# Patient Record
Sex: Male | Born: 1985 | Race: Black or African American | Hispanic: No | Marital: Single | Smoking: Current every day smoker
Health system: Southern US, Community
[De-identification: ages and names within clinical notes are randomized; demographics above are authoritative.]

---

## 2004-08-20 ENCOUNTER — Emergency Department (HOSPITAL_COMMUNITY): Admission: EM | Admit: 2004-08-20 | Discharge: 2004-08-20 | Payer: Self-pay | Admitting: Emergency Medicine

## 2013-10-20 ENCOUNTER — Emergency Department (INDEPENDENT_AMBULATORY_CARE_PROVIDER_SITE_OTHER)
Admission: EM | Admit: 2013-10-20 | Discharge: 2013-10-20 | Disposition: A | Discharge status: Home / Self Care | Payer: PRIVATE HEALTH INSURANCE | Source: Home / Self Care

## 2013-10-20 ENCOUNTER — Encounter (HOSPITAL_COMMUNITY): Payer: Self-pay | Admitting: Emergency Medicine

## 2013-10-20 DIAGNOSIS — J029 Acute pharyngitis, unspecified: Secondary | ICD-10-CM

## 2013-10-20 LAB — POCT RAPID STREP A: Streptococcus, Group A Screen (Direct): NEGATIVE

## 2013-10-20 MED ORDER — AMOXICILLIN 875 MG PO TABS: 875.0000 mg | ORAL_TABLET | Freq: Two times a day (BID) | ORAL | Status: DC

## 2013-10-20 MED ORDER — DEXAMETHASONE SODIUM PHOSPHATE 10 MG/ML IJ SOLN
10.0000 mg | Freq: Once | INTRAMUSCULAR | Status: AC
  Administered 2013-10-20: 10 mg

## 2013-10-20 MED ORDER — DEXAMETHASONE 10 MG/ML FOR PEDIATRIC ORAL USE
INTRAMUSCULAR | Status: AC
  Filled 2013-10-20: qty 1

## 2013-10-20 NOTE — ED Notes (Signed)
C/o sore throat two days  States he can barely talk OTC medication taking but no relief.

## 2013-10-20 NOTE — ED Provider Notes (Signed)
CSN: [id number]     Arrival date & time 10/20/13  0808 History   None    Chief Complaint  Patient presents with  . Sore Throat     (Consider location/radiation/quality/duration/timing/severity/associated sxs/prior Treatment) Patient is a 28 y.o. male presenting with pharyngitis. The history is provided by the patient.  Sore Throat This is a new problem. The current episode started 2 days ago. The problem occurs constantly. The problem has been gradually worsening. Associated symptoms include headaches. The symptoms are aggravated by swallowing. Nothing relieves the symptoms. Treatments tried: otc cold medicines. The treatment provided no relief.    History reviewed. No pertinent past medical history. History reviewed. No pertinent past surgical history. History reviewed. No pertinent family history. History  Substance Use Topics  . Smoking status: Not on file  . Smokeless tobacco: Not on file  . Alcohol Use: Not on file    Review of Systems  Constitutional: Negative for chills and fatigue.  HENT: Positive for postnasal drip and sore throat. Negative for congestion, ear pain and sinus pressure.   Respiratory: Negative for cough.   Neurological: Positive for headaches.      Allergies  Review of patient's allergies indicates no known allergies.  Home Medications   Current Outpatient Rx  Name  Route  Sig  Dispense  Refill  . amoxicillin (AMOXIL) 875 MG tablet   Oral   Take 1 tablet (875 mg total) by mouth 2 (two) times daily.   20 tablet   0    BP 149/76  Pulse 96  Temp(Src) 98.9 F (37.2 C) (Oral)  Resp 18  SpO2 95% Physical Exam  Constitutional: He appears well-developed and well-nourished.  Appears in pain  HENT:  Right Ear: Tympanic membrane, external ear and ear canal normal.  Left Ear: Tympanic membrane, external ear and ear canal normal.  Nose: Nose normal.  Mouth/Throat: Posterior oropharyngeal edema and posterior oropharyngeal erythema present. No  tonsillar abscesses.  Cardiovascular: Normal rate and regular rhythm.   Pulmonary/Chest: Effort normal and breath sounds normal.  Lymphadenopathy:       Head (right side): No submental, no submandibular and no tonsillar adenopathy present.       Head (left side): No submental, no submandibular and no tonsillar adenopathy present.    He has no cervical adenopathy.    ED Course  Procedures (including critical care time) Labs Review Labs Reviewed  POCT RAPID STREP A ([facility] CARE ONLY)   Imaging Review No results found.    MDM   Final diagnoses:  Pharyngitis   [facility]  Rx amoxicillin 875mg  po BID #20. Suspicious for strep despite negative rapid strep.       Cathlyn ParsonsAngela M Kami Kube, NP 10/20/13 [phone]

## 2013-10-20 NOTE — ED Provider Notes (Signed)
Medical screening examination/treatment/procedure(s) were performed by non-physician practitioner and as supervising physician I was immediately available for consultation/collaboration.  Sona Nations, M.D.  Jahad Old C Liller Yohn, MD 10/20/13 0952 

## 2013-10-20 NOTE — Discharge Instructions (Signed)
Gargle with warm salt water several times a day to help reduce the swelling. Use tylenol or ibuprofen for the pain also. If you are not getting better, or if you are getting much worse, come back.    Pharyngitis Pharyngitis is redness, pain, and swelling (inflammation) of your pharynx.  CAUSES  Pharyngitis is usually caused by infection. Most of the time, these infections are from viruses (viral) and are part of a cold. However, sometimes pharyngitis is caused by bacteria (bacterial). Pharyngitis can also be caused by allergies. Viral pharyngitis may be spread from person to person by coughing, sneezing, and personal items or utensils (cups, forks, spoons, toothbrushes). Bacterial pharyngitis may be spread from person to person by more intimate contact, such as kissing.  SIGNS AND SYMPTOMS  Symptoms of pharyngitis include:   Sore throat.   Tiredness (fatigue).   Low-grade fever.   Headache.  Joint pain and muscle aches.  Skin rashes.  Swollen lymph nodes.  Plaque-like film on throat or tonsils (often seen with bacterial pharyngitis). DIAGNOSIS  Your health care provider will ask you questions about your illness and your symptoms. Your medical history, along with a physical exam, is often all that is needed to diagnose pharyngitis. Sometimes, a rapid strep test is done. Other lab tests may also be done, depending on the suspected cause.  TREATMENT  Viral pharyngitis will usually get better in 3 4 days without the use of medicine. Bacterial pharyngitis is treated with medicines that kill germs (antibiotics).  HOME CARE INSTRUCTIONS   Drink enough water and fluids to keep your urine clear or pale yellow.   Only take over-the-counter or prescription medicines as directed by your health care provider:   If you are prescribed antibiotics, make sure you finish them even if you start to feel better.   Do not take aspirin.   Get lots of rest.   Gargle with 8 oz of salt water  ( tsp of salt per 1 qt of water) as often as every 1 2 hours to soothe your throat.   Throat lozenges (if you are not at risk for choking) or sprays may be used to soothe your throat. SEEK MEDICAL CARE IF:   You have large, tender lumps in your neck.  You have a rash.  You cough up green, yellow-brown, or bloody spit. SEEK IMMEDIATE MEDICAL CARE IF:   Your neck becomes stiff.  You drool or are unable to swallow liquids.  You vomit or are unable to keep medicines or liquids down.  You have severe pain that does not go away with the use of recommended medicines.  You have trouble breathing (not caused by a stuffy nose). MAKE SURE YOU:   Understand these instructions.  Will watch your condition.  Will get help right away if you are not doing well or get worse. Document Released: 08/18/2005 Document Revised: 06/08/2013 Document Reviewed: 04/25/2013 [facility] Patient Information 2014 [employer], [place].

## 2013-10-22 LAB — CULTURE, GROUP A STREP

## 2013-11-22 ENCOUNTER — Encounter (HOSPITAL_COMMUNITY): Payer: Self-pay | Admitting: Emergency Medicine

## 2013-11-22 ENCOUNTER — Emergency Department (INDEPENDENT_AMBULATORY_CARE_PROVIDER_SITE_OTHER)
Admission: EM | Admit: 2013-11-22 | Discharge: 2013-11-22 | Disposition: A | Discharge status: Home / Self Care | Payer: Self-pay | Source: Home / Self Care

## 2013-11-22 DIAGNOSIS — H109 Unspecified conjunctivitis: Secondary | ICD-10-CM

## 2013-11-22 MED ORDER — TOBRAMYCIN 0.3 % OP SOLN: 1.0000 [drp] | OPHTHALMIC | Status: DC

## 2013-11-22 NOTE — ED Provider Notes (Signed)
Medical screening examination/treatment/procedure(s) were performed by resident physician or non-physician practitioner and as supervising physician I was immediately available for consultation/collaboration.   KINDL,JAMES DOUGLAS MD.   James D Kindl, MD 11/22/13 2129 

## 2013-11-22 NOTE — Discharge Instructions (Signed)

## 2013-11-22 NOTE — ED Notes (Addendum)
C/o L eye redness and pain onset yesterday.  C/o photophobia, slight headache and eye ache yesterday 1/10.  States vision is slightly blurred.  Wears contacts but took them out yesterday.  L upper eyelid is puffy.

## 2013-11-22 NOTE — ED Provider Notes (Signed)
CSN: [id number]     Arrival date & time 11/22/13  1702 History   None    Chief Complaint  Patient presents with  . Eye Pain   (Consider location/radiation/quality/duration/timing/severity/associated sxs/prior Treatment) HPI Comments: No changes in vision, fever, eye pain, discharge. Does wear extended wear contact lenses. No recent injury. No recent URI sx. No photophobia.  Patient is a 28 y.o. male presenting with conjunctivitis. The history is provided by the patient.  Conjunctivitis This is a new problem. The current episode started yesterday. The problem occurs constantly. The problem has been gradually worsening.    History reviewed. No pertinent past medical history. History reviewed. No pertinent past surgical history. History reviewed. No pertinent family history. History  Substance Use Topics  . Smoking status: Current Every Day Smoker -- 1.00 packs/day    Types: Cigarettes  . Smokeless tobacco: Not on file  . Alcohol Use: 4.2 oz/week    7 Cans of beer per week    Review of Systems  All other systems reviewed and are negative.    Allergies  Review of patient's allergies indicates no known allergies.  Home Medications   Current Outpatient Rx  Name  Route  Sig  Dispense  Refill  . amoxicillin (AMOXIL) 875 MG tablet   Oral   Take 1 tablet (875 mg total) by mouth 2 (two) times daily.   20 tablet   0   . tobramycin (TOBREX) 0.3 % ophthalmic solution   Left Eye   Place 1 drop into the left eye every 4 (four) hours. X 7 days   5 mL   0    BP 152/96  Pulse 83  Temp(Src) 98.2 F (36.8 C) (Oral)  Resp 18  SpO2 100% Physical Exam  Nursing note and vitals reviewed. Constitutional: He is oriented to person, place, and time. He appears well-developed and well-nourished. No distress.  HENT:  Head: Normocephalic and atraumatic.  Eyes: EOM and lids are normal. Pupils are equal, round, and reactive to light. Right eye exhibits no discharge. Left eye exhibits no  discharge, no exudate and no hordeolum. No foreign body present in the left eye. Left conjunctiva is injected. Left conjunctiva has no hemorrhage. No scleral icterus.  Slit lamp exam:      The left eye shows no corneal abrasion, no corneal ulcer, no foreign body, no hyphema, no hypopyon and no fluorescein uptake.  Neck: Normal range of motion. Neck supple.  Cardiovascular: Normal rate.   Pulmonary/Chest: Effort normal.  Musculoskeletal: Normal range of motion.  Neurological: He is alert and oriented to person, place, and time.  Skin: Skin is warm and dry.  Psychiatric: He has a normal mood and affect. His behavior is normal.    ED Course  Procedures (including critical care time) Labs Review Labs Reviewed - No data to display Imaging Review No results found.   MDM   1. Conjunctivitis    Advised discarding recently worn contact lenses and not return to contact use for at least two weeks. Tobrex as prescribed and follow up with his ophthalmologist if symptoms do not improve.   Jess BartersJennifer Lee Clifton SpringsPresson, GeorgiaPA 11/22/13 [id number]

## 2014-08-03 ENCOUNTER — Encounter (HOSPITAL_COMMUNITY): Payer: Self-pay | Admitting: *Deleted

## 2014-08-03 ENCOUNTER — Emergency Department (INDEPENDENT_AMBULATORY_CARE_PROVIDER_SITE_OTHER)
Admission: EM | Admit: 2014-08-03 | Discharge: 2014-08-03 | Disposition: A | Discharge status: Home / Self Care | Payer: 59 | Source: Home / Self Care | Attending: Emergency Medicine | Admitting: Emergency Medicine

## 2014-08-03 DIAGNOSIS — M7701 Medial epicondylitis, right elbow: Secondary | ICD-10-CM

## 2014-08-03 MED ORDER — DICLOFENAC SODIUM 1 % TD GEL: 2.0000 g | Freq: Four times a day (QID) | TRANSDERMAL | Status: DC

## 2014-08-03 NOTE — ED Provider Notes (Signed)
CSN: [id number]     Arrival date & time 08/03/14  1537 History   First MD Initiated Contact with Patient 08/03/14 1606     Chief Complaint  Patient presents with  . Elbow Problem   (Consider location/radiation/quality/duration/timing/severity/associated sxs/prior Treatment) HPI          28 year old male presents for evaluation of right elbow pain. This pain is been present for about one week. He thinks he has sprained it but he denies any injury. He has pain in the elbow that is worse when he flexes his wrist. When he flexes his elbow he also has pain in the anterior elbow. It started gradually and has gotten progressively worse. He has tried icing it without much relief. He denies doing any regular heavy lifting, he spends most of his day typing on a computer. He denies any numbness in the arm. He feels slight swelling in the elbow. No systemic symptoms including no fever, chills, NVD. No history of inflammatory arthritis.  History reviewed. No pertinent past medical history. History reviewed. No pertinent past surgical history. No family history on file. History  Substance Use Topics  . Smoking status: Current Every Day Smoker -- 1.00 packs/day    Types: Cigarettes  . Smokeless tobacco: Not on file  . Alcohol Use: 4.2 oz/week    7 Cans of beer per week    Review of Systems  Musculoskeletal: Positive for joint swelling and arthralgias.  All other systems reviewed and are negative.   Allergies  Review of patient's allergies indicates no known allergies.  Home Medications   Prior to Admission medications   Medication Sig Start Date End Date Taking? Authorizing Provider  amoxicillin (AMOXIL) 875 MG tablet Take 1 tablet (875 mg total) by mouth 2 (two) times daily. 10/20/13   Cathlyn ParsonsAngela M Kabbe, NP  diclofenac sodium (VOLTAREN) 1 % GEL Apply 2 g topically 4 (four) times daily. 08/03/14   Adrian BlackwaterZachary H Lelend Heinecke, PA-C  tobramycin (TOBREX) 0.3 % ophthalmic solution Place 1 drop into the left eye  every 4 (four) hours. X 7 days 11/22/13   Jess BartersJennifer Lee H Presson, PA   BP 146/93 mmHg  Pulse 69  Temp(Src) 98.6 F (37 C) (Oral)  Resp 14  SpO2 98% Physical Exam  Constitutional: He is oriented to person, place, and time. He appears well-developed and well-nourished. No distress.  HENT:  Head: Normocephalic.  Cardiovascular:  Pulses:      Radial pulses are 2+ on the right side.  Pulmonary/Chest: Effort normal. No respiratory distress.  Musculoskeletal:       Right elbow: He exhibits swelling (very slight swelling over the medial epicondyles). Tenderness found. Medial epicondyle (And in the groove directly posterior to the medial epicondyle) tenderness noted.  With the elbow in extension, there is pain in the area of the medial epicondyles with wrist flexion against resistance  Neurological: He is alert and oriented to person, place, and time. Coordination normal.  Skin: Skin is warm and dry. No rash noted. He is not diaphoretic.  Psychiatric: He has a normal mood and affect. Judgment normal.  Nursing note and vitals reviewed.   ED Course  Procedures (including critical care time) Labs Review Labs Reviewed - No data to display  Imaging Review No results found.   MDM   1. Medial epicondylitis of elbow, right    Consistent with medial epicondylitis. We'll treat initially with diclofenac gel 4 times a day. Follow-up with sports medicine if no improvement.  Meds ordered this encounter  Medications  . diclofenac sodium (VOLTAREN) 1 % GEL    Sig: Apply 2 g topically 4 (four) times daily.    Dispense:  200 g    Refill:  1    Order Specific Question:  Supervising Provider    Answer:  Bradd CanaryKINDL, JAMES D [[id number]]      Graylon GoodZachary H Susana Gripp, PA-C 08/03/14 [phone]

## 2014-08-03 NOTE — ED Notes (Addendum)
Pt reports   Symptoms of  Pain  r elbow     For   About 1  Week     denys any injury        Pt  Reports  pain is  worse on  movements  And  Certain  posistion

## 2014-08-03 NOTE — Discharge Instructions (Signed)
Medial Epicondylitis (Golfer's Elbow) with Rehab Medial epicondylitis involves inflammation and pain around the inner (medial) portion of the elbow. This pain is caused by inflammation of the tendons in the forearm that flex (bring down) the wrist. Medial epicondylitis is also called golfer's elbow, because it is common among golfers. However, it may occur in any individual who flexes the wrist regularly. If medial epicondylitis is left untreated, it may become a chronic problem. SYMPTOMS   Pain, tenderness, or inflammation over the inner (medial) side of the elbow.  Pain or weakness with gripping activities.  Pain that increases with wrist twisting motions (using a screwdriver, playing golf, bowling). CAUSES  Medial epicondylitis is caused by inflammation of the tendons that flex the wrist. Causes of injury may include:  Chronic, repetitive stress and strain to the tendons that run from the wrist and forearm to the elbow.  Sudden strain on the forearm, including wrist snap when serving balls with racquet sports, or throwing a baseball. RISK INCREASES WITH:  Sports or occupations that require repetitive and/or strenuous forearm and wrist movements (pitching a baseball, golfing, carpentry).  Poor wrist and forearm strength and flexibility.  Failure to warm up properly before activity.  Resuming activity before healing, rehabilitation, and conditioning are complete. PREVENTION   Warm up and stretch properly before activity.  Maintain physical fitness:  Strength, flexibility, and endurance.  Cardiovascular fitness.  Wear and use properly fitted equipment.  Learn and use proper technique and have a coach correct improper technique.  Wear a tennis elbow (counterforce) brace. PROGNOSIS  The course of this condition depends on the degree of the injury. If treated properly, acute cases (symptoms lasting less than 4 weeks) are often resolved in 2 to 6 weeks. Chronic (longer lasting  cases) often resolve in 3 to 6 months, but may require physical therapy. RELATED COMPLICATIONS   Frequently recurring symptoms, resulting in a chronic problem. Properly treating the problem the first time decreases frequency of recurrence.  Chronic inflammation, scarring, and partial tendon tear, requiring surgery.  Delayed healing or resolution of symptoms. TREATMENT  Treatment first involves the use of ice and medicine, to reduce pain and inflammation. Strengthening and stretching exercises may reduce discomfort, if performed regularly. These exercises may be performed at home, if the condition is an acute injury. Chronic cases may require a referral to a physical therapist for evaluation and treatment. Your caregiver may advise a corticosteroid injection to help reduce inflammation. Rarely, surgery is needed. MEDICATION  If pain medicine is needed, nonsteroidal anti-inflammatory medicines (aspirin and ibuprofen), or other minor pain relievers (acetaminophen), are often advised.  Do not take pain medicine for 7 days before surgery.  Prescription pain relievers may be given, if your caregiver thinks they are needed. Use only as directed and only as much as you need.  Corticosteroid injections may be recommended. These injections should be reserved only for the most severe cases, because they can only be given a certain number of times. HEAT AND COLD  Cold treatment (icing) should be applied for 10 to 15 minutes every 2 to 3 hours for inflammation and pain, and immediately after activity that aggravates your symptoms. Use ice packs or an ice massage.  Heat treatment may be used before performing stretching and strengthening activities prescribed by your caregiver, physical therapist, or athletic trainer. Use a heat pack or a warm water soak. SEEK MEDICAL CARE IF: Symptoms get worse or do not improve in 2 weeks, despite treatment. EXERCISES  RANGE OF MOTION (  ROM) AND STRETCHING EXERCISES -  Epicondylitis, Medial (Golfer's Elbow) These exercises may help you when beginning to rehabilitate your injury. Your symptoms may go away with or without further involvement from your physician, physical therapist or athletic trainer. While completing these exercises, remember:   Restoring tissue flexibility helps normal motion to return to the joints. This allows healthier, less painful movement and activity.  An effective stretch should be held for at least 30 seconds.  A stretch should never be painful. You should only feel a gentle lengthening or release in the stretched tissue. RANGE OF MOTION - Wrist Flexion, Active-Assisted  Extend your right / left elbow with your fingers pointing down.*  Gently pull the back of your hand towards you, until you feel a gentle stretch on the top of your forearm.  Hold this position for __________ seconds. Repeat __________ times. Complete this exercise __________ times per day.  *If directed by your physician, physical therapist or athletic trainer, complete this stretch with your elbow bent, rather than extended. RANGE OF MOTION - Wrist Extension, Active-Assisted  Extend your right / left elbow and turn your palm upwards.*  Gently pull your palm and fingertips back, so your wrist extends and your fingers point more toward the ground.  You should feel a gentle stretch on the inside of your forearm.  Hold this position for __________ seconds. Repeat __________ times. Complete this exercise __________ times per day. *If directed by your physician, physical therapist or athletic trainer, complete this stretch with your elbow bent, rather than extended. STRETCH - Wrist Extension   Place your right / left fingertips on a tabletop leaving your elbow slightly bent. Your fingers should point backwards.  Gently press your fingers and palm down onto the table, by straightening your elbow. You should feel a stretch on the inside of your forearm.  Hold  this position for __________ seconds. Repeat __________ times. Complete this stretch __________ times per day.  STRENGTHENING EXERCISES - Epicondylitis, Medial (Golfer's Elbow) These exercises may help you when beginning to rehabilitate your injury. They may resolve your symptoms with or without further involvement from your physician, physical therapist or athletic trainer. While completing these exercises, remember:   Muscles can gain both the endurance and the strength needed for everyday activities through controlled exercises.  Complete these exercises as instructed by your physician, physical therapist or athletic trainer. Increase the resistance and repetitions only as guided.  You may experience muscle soreness or fatigue, but the pain or discomfort you are trying to eliminate should never worsen during these exercises. If this pain does get worse, stop and make sure you are following the directions exactly. If the pain is still present after adjustments, discontinue the exercise until you can discuss the trouble with your caregiver. STRENGTH - Wrist Flexors  Sit with your right / left forearm palm-up, and fully supported on a table or countertop. Your elbow should be resting below the height of your shoulder. Allow your wrist to extend over the edge of the surface.  Loosely holding a __________ weight, or a piece of rubber exercise band or tubing, slowly curl your hand up toward your forearm.  Hold this position for __________ seconds. Slowly lower the wrist back to the starting position in a controlled manner. Repeat __________ times. Complete this exercise __________ times per day.  STRENGTH - Wrist Extensors  Sit with your right / left forearm palm-down and fully supported. Your elbow should be resting below the height of your shoulder.   Allow your wrist to extend over the edge of the surface.  Loosely holding a __________ weight, or a piece of rubber exercise band or tubing, slowly  curl your hand up toward your forearm.  Hold this position for __________ seconds. Slowly lower the wrist back to the starting position in a controlled manner. Repeat __________ times. Complete this exercise __________ times per day.  STRENGTH - Ulnar Deviators  Stand with a ____________________ weight in your right / left hand, or sit while holding a rubber exercise band or tubing, with your healthy arm supported on a table or countertop.  Move your wrist so that your pinkie travels toward your forearm and your thumb moves away from your forearm.  Hold this position for __________ seconds and then slowly lower the wrist back to the starting position. Repeat __________ times. Complete this exercise __________ times per day STRENGTH - Grip   Grasp a tennis ball, a dense sponge, or a large, rolled sock in your hand.  Squeeze as hard as you can, without increasing any pain.  Hold this position for __________ seconds. Release your grip slowly. Repeat __________ times. Complete this exercise __________ times per day.  STRENGTH - Forearm Supinators   Sit with your right / left forearm supported on a table, keeping your elbow below shoulder height. Rest your hand over the edge, palm down.  Gently grip a hammer or a soup ladle.  Without moving your elbow, slowly turn your palm and hand upward to a "thumbs-up" position.  Hold this position for __________ seconds. Slowly return to the starting position. Repeat __________ times. Complete this exercise __________ times per day.  STRENGTH - Forearm Pronators  Sit with your right / left forearm supported on a table, keeping your elbow below shoulder height. Rest your hand over the edge, palm up.  Gently grip a hammer or a soup ladle.  Without moving your elbow, slowly turn your palm and hand upward to a "thumbs-up" position.  Hold this position for __________ seconds. Slowly return to the starting position. Repeat __________ times. Complete  this exercise __________ times per day.  Document Released: 08/18/2005 Document Revised: 11/10/2011 Document Reviewed: 11/30/2008 [employer] Patient Information 2015 [employer]. This information is not intended to replace advice given to you by your health care provider. Make sure you discuss any questions you have with your health care provider.  

## 2015-07-02 ENCOUNTER — Emergency Department (HOSPITAL_COMMUNITY)
Admission: EM | Admit: 2015-07-02 | Discharge: 2015-07-02 | Disposition: A | Discharge status: Home / Self Care | Payer: 59 | Attending: Emergency Medicine | Admitting: Emergency Medicine

## 2015-07-02 ENCOUNTER — Encounter (HOSPITAL_COMMUNITY): Payer: Self-pay | Admitting: Emergency Medicine

## 2015-07-02 ENCOUNTER — Emergency Department (HOSPITAL_COMMUNITY): Payer: 59

## 2015-07-02 DIAGNOSIS — Y9367 Activity, basketball: Secondary | ICD-10-CM | POA: Insufficient documentation

## 2015-07-02 DIAGNOSIS — X58XXXA Exposure to other specified factors, initial encounter: Secondary | ICD-10-CM | POA: Insufficient documentation

## 2015-07-02 DIAGNOSIS — Z720 Tobacco use: Secondary | ICD-10-CM | POA: Insufficient documentation

## 2015-07-02 DIAGNOSIS — Z791 Long term (current) use of non-steroidal anti-inflammatories (NSAID): Secondary | ICD-10-CM | POA: Insufficient documentation

## 2015-07-02 DIAGNOSIS — Y92310 Basketball court as the place of occurrence of the external cause: Secondary | ICD-10-CM | POA: Insufficient documentation

## 2015-07-02 DIAGNOSIS — Y998 Other external cause status: Secondary | ICD-10-CM | POA: Insufficient documentation

## 2015-07-02 DIAGNOSIS — M25562 Pain in left knee: Secondary | ICD-10-CM

## 2015-07-02 DIAGNOSIS — S8392XA Sprain of unspecified site of left knee, initial encounter: Secondary | ICD-10-CM | POA: Insufficient documentation

## 2015-07-02 DIAGNOSIS — M25462 Effusion, left knee: Secondary | ICD-10-CM | POA: Insufficient documentation

## 2015-07-02 DIAGNOSIS — Z792 Long term (current) use of antibiotics: Secondary | ICD-10-CM | POA: Insufficient documentation

## 2015-07-02 MED ORDER — NAPROXEN 500 MG PO TABS: 500.0000 mg | ORAL_TABLET | Freq: Two times a day (BID) | ORAL | Status: DC | PRN

## 2015-07-02 MED ORDER — HYDROCODONE-ACETAMINOPHEN 5-325 MG PO TABS
1.0000 | ORAL_TABLET | Freq: Once | ORAL | Status: AC
Start: 2015-07-02 — End: 2015-07-02
  Administered 2015-07-02: 1 via ORAL
  Filled 2015-07-02: qty 1

## 2015-07-02 MED ORDER — HYDROCODONE-ACETAMINOPHEN 5-325 MG PO TABS: 1.0000 | ORAL_TABLET | Freq: Four times a day (QID) | ORAL | Status: DC | PRN

## 2015-07-02 NOTE — Discharge Instructions (Signed)
Wear knee immobilizer for at least 2 weeks for stabilization of knee. Use crutches as needed for comfort. Ice and elevate knee throughout the day. Alternate between naprosyn and norco for pain relief. Do not drive or operate machinery with pain medication use. Call orthopedic follow up today or tomorrow to schedule followup appointment for recheck of ongoing knee pain in one to two weeks that can be canceled with a 24-48 hour notice if complete resolution of pain. Return to the ER for changes or worsening symptoms.   Knee Sprain A knee sprain is a tear in the strong bands of tissue that connect the bones (ligaments) of your knee. HOME CARE  Raise (elevate) your injured knee to lessen puffiness (swelling).  To ease pain and puffiness, put ice on the injured area.  Put ice in a plastic bag.  Place a towel between your skin and the bag.  Leave the ice on for 20 minutes, 2-3 times a day.  Only take medicine as told by your doctor.  Do not leave your knee unprotected until pain and stiffness go away (usually 4-6 weeks).  If you have a cast or splint, do not get it wet. If your doctor told you to not take it off, cover it with a plastic bag when you shower or bathe. Do not swim.  Your doctor may have you do exercises to prevent or limit permanent weakness and stiffness. GET HELP RIGHT AWAY IF:   Your cast or splint becomes damaged.  Your pain gets worse.  You have a lot of pain, puffiness, or numbness below the cast or splint. MAKE SURE YOU:   Understand these instructions.  Will watch your condition.  Will get help right away if you are not doing well or get worse.   This information is not intended to replace advice given to you by your health care provider. Make sure you discuss any questions you have with your health care provider.   Document Released: 08/06/2009 Document Revised: 08/23/2013 Document Reviewed: 04/26/2013 Elsevier Interactive Patient Education 2016 Else[employer]  Inc[employer]  Knee Effusion Knee effusion means that you have extra fluid in your knee. This can cause pain. Your knee may be more difficult to bend and move. HOME CARE  Use crutches as told by your doctor.  Wear a knee brace as told by your doctor.  Apply ice to the swollen area:  Put ice in a plastic bag.  Place a towel between your skin and the bag.  Leave the ice on for 20 minutes, 2-3 times per day.  Keep your knee raised (elevated) when you are sitting or lying down.  Take medicines only as told by your doctor.  Do any rehabilitation or strengthening exercises as told by your doctor.  Rest your knee as told by your doctor. You may start doing your normal activities again when your doctor says it is okay.  Keep all follow-up visits as told by your doctor. This is important. GET HELP IF:   You continue to have pain in your knee. GET HELP RIGHT AWAY IF:  You have increased swelling or redness of your knee.  You have severe pain in your knee.  You have a fever.   This information is not intended to replace advice given to you by your health care provider. Make sure you discuss any questions you have with your health care provider.   Document Released: 09/20/2010 Document Revised: 09/08/2014 Document Reviewed: 04/03/2014 Elsevier Interactive Patient Education [employer]  Cryotherapy Cryotherapy is when you put ice on your injury. Ice helps lessen pain and puffiness (swelling) after an injury. Ice works the best when you start using it in the first 24 to 48 hours after an injury. HOME CARE  Put a dry or damp towel between the ice pack and your skin.  You may press gently on the ice pack.  Leave the ice on for no more than 10 to 20 minutes at a time.  Check your skin after 5 minutes to make sure your skin is okay.  Rest at least 20 minutes between ice pack uses.  Stop using ice when your skin loses feeling (numbness).  Do not use ice on someone who cannot  tell you when it hurts. This includes small children and people with memory problems (dementia). GET HELP RIGHT AWAY IF:  You have white spots on your skin.  Your skin turns blue or pale.  Your skin feels waxy or hard.  Your puffiness gets worse. MAKE SURE YOU:   Understand these instructions.  Will watch your condition.  Will get help right away if you are not doing well or get worse.   This information is not intended to replace advice given to you by your health care provider. Make sure you discuss any questions you have with your health care provider.   Document Released: 02/04/2008 Document Revised: 11/10/2011 Document Reviewed: 04/10/2011 Elsevier Interactive Patient Education [employer] Inc[employer]

## 2015-07-02 NOTE — ED Notes (Signed)
Patient returned from xray.

## 2015-07-02 NOTE — ED Notes (Signed)
Pt reports he thinks he dislocated his L knee while playing basketball last night; feels like it has popped back in

## 2015-07-02 NOTE — ED Provider Notes (Signed)
CSN: [id number]     Arrival date & time 07/02/15  1305 History  By signing my name below, I, Mario Mitchell, attest that this documentation has been prepared under the direction and in the presence of [name], PA-C Electronically Signed: [name], ED Scribe. 07/02/2015. 1:42 PM.    Chief Complaint  Patient presents with  . Knee Injury   Patient is a 29 y.o. male presenting with knee pain. The history is provided by the patient. No language interpreter was used.  Knee Pain Location:  Knee Time since incident:  1 day Injury: yes   Mechanism of injury comment:  Playing basketball Knee location:  L knee Pain details:    Quality:  Dull   Radiates to:  Does not radiate   Severity:  Mild   Onset quality:  Sudden   Duration:  1 day   Timing:  Intermittent   Progression:  Unchanged Chronicity:  New Dislocation: yes (states it feels like it dislocated and then popped back in)   Foreign body present:  No foreign bodies Prior injury to area:  Yes Relieved by:  Acetaminophen Worsened by:  Bearing weight, extension and flexion Ineffective treatments:  None tried Associated symptoms: swelling   Associated symptoms: no decreased ROM, no fever, no muscle weakness, no numbness and no tingling     HPI Comments: Mario Mitchell is a 29 y.o. male who presents to the Emergency Department complaining of intermittent, mild, 4/10, dull, non radiating, left knee pain onset last night while playing basketball. Pt states he was lunging to get a basketball and he felt like the knee "popped out of place and then popped back in". He reports associated mild swelling to the knee. He notes that he has done this before and it feels similar to how it did in the past. Pt states the pain is exacerbated with bending and bearing weight. He endorses he has taking acetaminophen with mild relief. He states he is ambulatory with crutches, but can't bear weight due to pain. Pt denies having an  orthopedist or other specialist that he follows up with for his knee injuries. He denies any numbness, tingling, weakness, redness, warmth, bruising, chest pain, SOB, fever, chills, abdominal pain, nausea, vomiting, diarrhea, constipation, difficulty urinating, dysuria, hematuria, or other injuries.  History reviewed. No pertinent past medical history. History reviewed. No pertinent past surgical history. History reviewed. No pertinent family history. Social History  Substance Use Topics  . Smoking status: Current Every Day Smoker -- 1.00 packs/day    Types: Cigarettes  . Smokeless tobacco: Not on file  . Alcohol Use: 4.2 oz/week    7 Cans of beer per week    Review of Systems  Constitutional: Negative for fever and chills.  Respiratory: Negative for shortness of breath.   Cardiovascular: Negative for chest pain.  Gastrointestinal: Negative for nausea, vomiting, abdominal pain, diarrhea and constipation.  Genitourinary: Negative for dysuria, hematuria and difficulty urinating.  Musculoskeletal: Positive for joint swelling and arthralgias.  Skin: Negative for color change and wound.  Allergic/Immunologic: Negative for immunocompromised state.  Neurological: Negative for weakness and numbness.  Psychiatric/Behavioral: Negative for confusion.  10 Systems reviewed and all are negative for acute change except as noted in the HPI.      Allergies  Review of patient's allergies indicates no known allergies.  Home Medications   Prior to Admission medications   Medication Sig Start Date End Date Taking? Authorizing Provider  amoxicillin (AMOXIL) 875 MG tablet Take 1 tablet (  875 mg total) by mouth 2 (two) times daily. 10/20/13   Cathlyn Parsons, NP  diclofenac sodium (VOLTAREN) 1 % GEL Apply 2 g topically 4 (four) times daily. 08/03/14   Adrian Blackwater Baker, PA-C  tobramycin (TOBREX) 0.3 % ophthalmic solution Place 1 drop into the left eye every 4 (four) hours. X 7 days 11/22/13   Ria Clock, PA   Triage Vitals: BP 163/101 mmHg  Pulse 63  Temp(Src) 98.9 F (37.2 C) (Oral)  Resp 17  Ht  (1.905 m)  Wt 195 lb (88.451 kg)  BMI 24.37 kg/m2  SpO2 96%  Physical Exam  Constitutional: He is oriented to person, place, and time. Vital signs are normal. He appears well-developed and well-nourished.  Non-toxic appearance. No distress.  Afebrile, nontoxic, NAD  HENT:  Head: Normocephalic and atraumatic.  Mouth/Throat: Mucous membranes are normal.  Eyes: Conjunctivae and EOM are normal. Right eye exhibits no discharge. Left eye exhibits no discharge.  Neck: Normal range of motion. Neck supple.  Cardiovascular: Normal rate and intact distal pulses.   Pulmonary/Chest: Effort normal. No respiratory distress.  Abdominal: Normal appearance. He exhibits no distension.  Musculoskeletal: Normal range of motion.       Left knee: He exhibits swelling and effusion. He exhibits normal range of motion, no ecchymosis, no deformity, no erythema, normal alignment, no LCL laxity, normal patellar mobility and no MCL laxity. Tenderness found. Medial joint line and lateral joint line tenderness noted.  left knee with FROM intact, with mild medial and lateral joint line TTP, +swelling/effusion, no deformity, no bruising/erythema, no warmth, no abnormal alignment or patellar mobility, no varus/valgus laxity, neg anterior drawer test, no crepitus. Strength and sensation grossly intact. Distal pulses intact  Neurological: He is alert and oriented to person, place, and time. He has normal strength. No sensory deficit.  Skin: Skin is warm, dry and intact. No rash noted.  Psychiatric: He has a normal mood and affect.  Nursing note and vitals reviewed.   ED Course  Procedures (including critical care time)  DIAGNOSTIC STUDIES: Oxygen Saturation is 96% on RA, normal by my interpretation.    COORDINATION OF CARE: 2:06 PM- Will order left knee x-ray and Vicodin 5-325mg  tablet. Pt advised of plan  for treatment and pt agrees.  Labs Review Labs Reviewed - No data to display  Imaging Review Dg Knee Complete 4 Views Left  07/02/2015  CLINICAL DATA:  Playing basketball and last night, came down for med jump and his LEFT knee gave out, pain and inability to bear weight by LEFT knee since, medial and lateral pain, initial encounter EXAM: LEFT KNEE - COMPLETE 4+ VIEW COMPARISON:  None FINDINGS: Small joint effusion. Osseous mineralization normal. Joint spaces preserved. No acute fracture, dislocation, or bone destruction. IMPRESSION: Joint effusion without acute bony abnormalities. Electronically Signed   By: Ulyses Southward M.D.   On: 07/02/2015 14:30   I have personally reviewed and evaluated these images as part of my medical decision-making.   EKG Interpretation None      MDM   Final diagnoses:  Left knee pain  Left knee sprain, initial encounter  Knee swelling, left    29 y.o. male here with L knee pain after lunging forward on it yesterday, states it "dislocated", reports that this has happened in the past. NVI with soft compartments, tenderness to joint line, mild swelling noted. Will obtain xray imaging and give pain meds then reassess shortly. Of note, initially found to have BP  160s/100s, repeat BP improved to 150s/80s.  2:45 PM Xray with effusion but no acute changes. Will treat as knee sprain. Will give immobilizer, pt has crutches. Will have him use RICE, as well as pain meds given. F/up with ortho in 1-2wks. I explained the diagnosis and have given explicit precautions to return to the ER including for any other new or worsening symptoms. The patient understands and accepts the medical plan as it's been dictated and I have answered their questions. Discharge instructions concerning home care and prescriptions have been given. The patient is STABLE and is discharged to home in good condition.   I personally performed the services described in this documentation, which was  scribed in my presence. The recorded information has been reviewed and is accurate.  BP 156/86 mmHg  Pulse 79  Temp(Src) 98.8 F (37.1 C) (Oral)  Resp 16  Ht  (1.905 m)  Wt 195 lb (88.451 kg)  BMI 24.37 kg/m2  SpO2 100%  Meds ordered this encounter  Medications  . HYDROcodone-acetaminophen (NORCO/VICODIN) 5-325 MG per tablet 1 tablet    Sig:   . HYDROcodone-acetaminophen (NORCO) 5-325 MG tablet    Sig: Take 1 tablet by mouth every 6 (six) hours as needed for severe pain.    Dispense:  10 tablet    Refill:  0    Order Specific Question:  Supervising Provider    Answer:  Hyacinth Meeker, BRIAN [[id number]]  . naproxen (NAPROSYN) 500 MG tablet    Sig: Take 1 tablet (500 mg total) by mouth 2 (two) times daily as needed for mild pain, moderate pain or headache (TAKE WITH MEALS.).    Dispense:  20 tablet    Refill:  0    Order Specific Question:  Supervising Provider    Answer:  Eber Hong [[id number]]     Sigrid Schwebach Camprubi-Soms, PA-C 07/02/15 1446  Alvira Monday, MD 07/02/15 2339

## 2017-01-10 ENCOUNTER — Ambulatory Visit
Admission: EM | Admit: 2017-01-10 | Discharge: 2017-01-10 | Disposition: A | Discharge status: Home / Self Care | Payer: No Typology Code available for payment source | Attending: Family Medicine | Admitting: Family Medicine

## 2017-01-10 DIAGNOSIS — R17 Unspecified jaundice: Secondary | ICD-10-CM

## 2017-01-10 DIAGNOSIS — F1010 Alcohol abuse, uncomplicated: Secondary | ICD-10-CM

## 2017-01-10 DIAGNOSIS — Z87898 Personal history of other specified conditions: Secondary | ICD-10-CM

## 2017-01-10 DIAGNOSIS — R030 Elevated blood-pressure reading, without diagnosis of hypertension: Secondary | ICD-10-CM

## 2017-01-10 LAB — COMPREHENSIVE METABOLIC PANEL
ALK PHOS: 76 U/L (ref 38–126)
ALT: 21 U/L (ref 17–63)
AST: 30 U/L (ref 15–41)
Albumin: 4.7 g/dL (ref 3.5–5.0)
Anion gap: 8 (ref 5–15)
BUN: 10 mg/dL (ref 6–20)
CALCIUM: 9.6 mg/dL (ref 8.9–10.3)
CO2: 27 mmol/L (ref 22–32)
CREATININE: 0.98 mg/dL (ref 0.61–1.24)
Chloride: 99 mmol/L — ABNORMAL LOW (ref 101–111)
GFR calc Af Amer: >60 mL/min (ref >60)
Glucose, Bld: 96 mg/dL (ref 65–99)
Potassium: 3.8 mmol/L (ref 3.5–5.1)
Sodium: 134 mmol/L — ABNORMAL LOW (ref 135–145)
Total Bilirubin: 1.9 mg/dL — ABNORMAL HIGH (ref 0.3–1.2)
Total Protein: 8.4 g/dL — ABNORMAL HIGH (ref 6.5–8.1)

## 2017-01-10 LAB — CBC WITH DIFFERENTIAL/PLATELET
Basophils Absolute: 0 10*3/uL (ref 0–0.1)
Basophils Relative: 1 %
EOS PCT: 2 %
Eosinophils Absolute: 0.1 10*3/uL (ref 0–0.7)
HCT: 48.4 % (ref 40.0–52.0)
Hemoglobin: 16.6 g/dL (ref 13.0–18.0)
LYMPHS PCT: 35 %
Lymphs Abs: 2.1 10*3/uL (ref 1.0–3.6)
MCH: 32.3 pg (ref 26.0–34.0)
MCHC: 34.3 g/dL (ref 32.0–36.0)
MCV: 94.2 fL (ref 80.0–100.0)
MONO ABS: 0.8 10*3/uL (ref 0.2–1.0)
Monocytes Relative: 13 %
Neutro Abs: 3 10*3/uL (ref 1.4–6.5)
Neutrophils Relative %: 49 %
Platelets: 298 10*3/uL (ref 150–440)
RBC: 5.14 MIL/uL (ref 4.40–5.90)
RDW: 13 % (ref 11.5–14.5)
WBC: 6 10*3/uL (ref 3.8–10.6)

## 2017-01-10 MED ORDER — PEDIALYTE PO SOLN
1000.0000 mL | Freq: Once | ORAL | Status: AC
  Administered 2017-01-10: 1000 mL via ORAL

## 2017-01-10 NOTE — ED Provider Notes (Signed)
[facility] ____________________________________________  Time seen: Approximately 12:19 PM  I have reviewed the triage vital signs and the nursing notes.   HISTORY  Chief Complaint Dehydration   HPI Mario Mitchell is a 31 y.o. male presenting for evaluation after drinking alcohol Wednesday afternoon and evening. Patient reports he drank approximately 12 beers and 1/5 of liquor. Patient reports he drinks typically a few beers every few days, but does not drink every day. Patient states that he did drink a lot that day and then fell asleep early that evening. Patient initially stated that he passed out, but in clarifying this he states that he fell asleep hard that night but did not fall or pass out elsewhere. Denies any memory lapses. Patient reports that he woke up later that night and had several episodes of vomiting. Patient reports the last vomiting was early Thursday morning. Denies any diarrhea or bowel changes. Patient reports he has since been drinking fluids but states overall he still feels tired. Patient states he does not typically drink that much alcohol. Denies any drug use.  Denies chest pain, shortness of breath, abdominal pain, dysuria, extremity pain, extremity swelling or rash. Denies any changes in stool, melena, hematochezia, vomiting blood. Denies any recent sickness, cough, congestion, sore throat or fevers. Reports continues to drink fluids well, but has not had much of an appetite. Reports did eat eggs and bacon this morning and has been drinking water. Denies recent sickness. Denies recent antibiotic use. Reports continues to urinate well with normal clear appearing urine. Denies any syncope, near-syncope, dizziness or lightheadedness.  Patient denies any past medical history. However in discussing with patient patient reports that he has been told in the past that his blood pressure is elevated. Patient states that he has not further followed up regarding  his blood pressure or ever taking any hypertensive medications.   History reviewed. No pertinent past medical history.  There are no active problems to display for this patient.   History reviewed. No pertinent surgical history.   No current facility-administered medications for this encounter.  No current outpatient prescriptions on file.  Allergies Patient has no known allergies.  History reviewed. No pertinent family history.  Social History Social History  Substance Use Topics  . Smoking status: Former Smoker    Packs/day: 1.00    Types: Cigarettes  . Smokeless tobacco: Never Used  . Alcohol use 4.2 oz/week    7 Cans of beer per week    Review of Systems Constitutional: No fever/chills Eyes: No visual changes. ENT: No sore throat. Cardiovascular: Denies chest pain. Respiratory: Denies shortness of breath. Gastrointestinal: As above.  No diarrhea.  No constipation. Genitourinary: Negative for dysuria. Musculoskeletal: Negative for back pain. Skin: Negative for rash.   ____________________________________________   PHYSICAL EXAM:  VITAL SIGNS: ED Triage Vitals  Enc Vitals Group     BP 01/10/17 1206 (!) 166/108     Pulse Rate 01/10/17 1206 79     Resp 01/10/17 1206 18     Temp 01/10/17 1206 98.2 F (36.8 C)     Temp Source 01/10/17 1206 Oral     SpO2 01/10/17 1206 100 %     Weight 01/10/17 1207 185 lb (83.9 kg)     Height 01/10/17 1207 6\' 3"  (1.905 m)     Head Circumference --      Peak Flow --      Pain Score 01/10/17 1209 0     Pain Loc --  Pain Edu? --      Excl. in GC? --    Orthostatic VS for the past 24 hrs:  BP- Lying Pulse- Lying BP- Sitting Pulse- Sitting BP- Standing at 0 minutes Pulse- Standing at 0 minutes  01/10/17 1243 (!) 155/105 65 (!) 171/115 72 (!) 175/123 72     Constitutional: Alert and oriented. Well appearing and in no acute distress. Eyes: Conjunctivae are normal. PERRL. EOMI. ENT      Head: Normocephalic and  atraumatic.      Nose: No congestion/rhinnorhea.      Mouth/Throat: Mucous membranes are moist.Oropharynx non-erythematous. Neck: No stridor. Supple without meningismus.  Hematological/Lymphatic/Immunilogical: No cervical lymphadenopathy. Cardiovascular: Normal rate, regular rhythm. Grossly normal heart sounds.  Good peripheral circulation. Respiratory: Normal respiratory effort without tachypnea nor retractions. Breath sounds are clear and equal bilaterally. No wheezes, rales, rhonchi. Gastrointestinal: Soft and nontender. No distention. Normal Bowel sounds. No CVA tenderness. Musculoskeletal: Steady gait. No midline cervical, thoracic or lumbar tenderness to palpation.  Neurologic:  Normal speech and language. Speech is normal. No gait instability.  Skin:  Skin is warm, dry Psychiatric: Mood and affect are normal. Speech and behavior are normal. Patient exhibits appropriate insight and judgment   ___________________________________________   LABS (all labs ordered are listed, but only abnormal results are displayed)  Labs Reviewed  COMPREHENSIVE METABOLIC PANEL - Abnormal; Notable for the following:       Result Value   Sodium 134 (*)    Chloride 99 (*)    Total Protein 8.4 (*)    Total Bilirubin 1.9 (*)    All other components within normal limits  CBC WITH DIFFERENTIAL/PLATELET     PROCEDURES Procedures   INITIAL IMPRESSION / ASSESSMENT AND PLAN / ED COURSE  Pertinent labs & imaging results that were available during my care of the patient were reviewed by me and considered in my medical decision making (see chart for details).  Well-appearing patient. No acute distress. Smiling and laughing in room. Abdomen soft and nontender. Patient drink Pedialyte in room. Laboratory work reviewed. Discussed in detail with patient bilirubin elevation and discussed monitoring. Suspect secondary to recent binge drinking. Encouraged patient to have lab rechecked next week. Liver enzymes  within normal range. Patient states it is feeling better as drinking Pedialyte. Patient states he is only been drinking water and, discussed use of Pedialyte Gatorade and water. Discussed in detail with patient encouraged fluids and gradual increase of food intake. Also discussed in detail patient with noted elevated blood pressure. As with recent sickness, discussed with patient importance of needing to follow-up with primary care physician for blood pressure management and establishment of baseline blood pressure. Discussed with patient to follow-up with his primary care physician next week and have blood pressure rechecked as well as bilirubin. Discussed strict follow-up and return parameters. Patient states that he had to call to work today as he has a very physical and sweating job, work note given for today.  Discussed follow up with Primary care physician this week. Discussed follow up and return parameters including no resolution or any worsening concerns. Patient verbalized understanding and agreed to plan.   ____________________________________________   FINAL CLINICAL IMPRESSION(S) / ED DIAGNOSES  Final diagnoses:  History of heavy alcohol consumption  Elevated bilirubin  Elevated blood pressure reading     There are no discharge medications for this patient.   Note: This dictation was prepared with Dragon dictation along with smaller phrase technology. Any transcriptional errors that result from  this process are unintentional.         Renford Dills, NP 01/10/17 1642

## 2017-01-10 NOTE — Discharge Instructions (Signed)
Rest. Drink plenty of fluids as discussed. Avoid alcohol consumption, and if consuming then consume mildly.  Follow up with your primary care physician this week as discussed for blood pressure follow up and lab recheck.  Return to Urgent care for new or worsening concerns.

## 2017-01-10 NOTE — ED Triage Notes (Signed)
Pt says that he was drinking beer and liquor on Thursday, he believes he passed out. The last time he threw up was Thursday, he says he feels really drained and thinks he is dehydrated, he is still voiding and denies a dry mouth.

## 2017-02-21 ENCOUNTER — Ambulatory Visit (INDEPENDENT_AMBULATORY_CARE_PROVIDER_SITE_OTHER): Payer: Self-pay

## 2017-02-21 ENCOUNTER — Encounter: Payer: Self-pay | Admitting: *Deleted

## 2017-02-21 ENCOUNTER — Ambulatory Visit
Admission: EM | Admit: 2017-02-21 | Discharge: 2017-02-21 | Disposition: A | Discharge status: Home / Self Care | Payer: Self-pay | Attending: Family | Admitting: Family

## 2017-02-21 DIAGNOSIS — R030 Elevated blood-pressure reading, without diagnosis of hypertension: Secondary | ICD-10-CM

## 2017-02-21 DIAGNOSIS — M25462 Effusion, left knee: Secondary | ICD-10-CM

## 2017-02-21 DIAGNOSIS — S86912A Strain of unspecified muscle(s) and tendon(s) at lower leg level, left leg, initial encounter: Secondary | ICD-10-CM

## 2017-02-21 MED ORDER — NAPROXEN 500 MG PO TABS
500.0000 mg | ORAL_TABLET | Freq: Two times a day (BID) | ORAL | 0 refills | Status: AC | PRN
Start: 2017-02-21 — End: ?

## 2017-02-21 NOTE — ED Triage Notes (Signed)
Pt was playing basketball six days ago and injured his Left knee then. Pt does have a hx of left knee pain. Pt has taken otc ibuprofen to help with pain.

## 2017-02-21 NOTE — Discharge Instructions (Signed)
Recommend start Naproxen 500mg  twice a day as directed for pain and swelling. Wear knee brace and crutches for support. Call Orthopedic on Monday to schedule appointment for further evaluation.

## 2017-02-22 NOTE — ED Provider Notes (Signed)
CSN: [id number]     Arrival date & time 02/21/17  1256 History   First MD Initiated Contact with Patient 02/21/17 1410     Chief Complaint  Patient presents with  . Left Knee Pain   (Consider location/radiation/quality/duration/timing/severity/associated sxs/prior Treatment) 31 year old male presents with injury to his left knee after playing basketball 6 days ago. He jumped up and landed on his left knee which twisted. He experienced immediate pain and was unable to bear weight on his knee. He has injured the same knee twice before playing basketball. He last had an x-ray in 2016 and requests another x-ray today. He has never seen an Orthopedic. He has been using a soft knee brace for support, taking Ibuprofen for pain and swelling and using crutches with some improvement in his knee pain. He is unable to work on crutches and needs FMLA paperwork completed for prolonged absence from work. He also has a history of elevated blood pressure which has not been treated. He does not have a PCP.    The history is provided by the patient.    History reviewed. No pertinent past medical history. History reviewed. No pertinent surgical history. History reviewed. No pertinent family history. Social History  Substance Use Topics  . Smoking status: Current Every Day Smoker    Packs/day: 1.00    Types: Cigarettes  . Smokeless tobacco: Never Used  . Alcohol use 4.2 oz/week    7 Cans of beer per week    Review of Systems  Constitutional: Negative for chills, fatigue and fever.  Eyes: Negative for photophobia and visual disturbance.  Gastrointestinal: Negative for nausea and vomiting.  Musculoskeletal: Positive for arthralgias, gait problem and joint swelling. Negative for back pain and neck pain.  Skin: Negative for color change, rash and wound.  Allergic/Immunologic: Negative for immunocompromised state.  Neurological: Negative for dizziness, tremors, seizures, syncope, weakness, numbness and  headaches.  Hematological: Negative for adenopathy. Does not bruise/bleed easily.    Allergies  Patient has no known allergies.  Home Medications   Prior to Admission medications   Medication Sig Start Date End Date Taking? Authorizing Provider  naproxen (NAPROSYN) 500 MG tablet Take 1 tablet (500 mg total) by mouth 2 (two) times daily as needed for moderate pain. 02/21/17   Sudie Grumbling, NP   Meds Ordered and Administered this Visit  Medications - No data to display  BP (!) 156/110 (BP Location: Left Arm)   Pulse 91   Temp 98.7 F (37.1 C) (Oral)   Resp 16   Ht 6\' 3"  (1.905 m)   Wt 185 lb (83.9 kg)   SpO2 100%   BMI 23.12 kg/m  No data found.   Physical Exam  Constitutional: He is oriented to person, place, and time. He appears well-developed and well-nourished. No distress.  HENT:  Head: Normocephalic and atraumatic.  Eyes: Conjunctivae and EOM are normal.  Neck: Normal range of motion. Neck supple.  Cardiovascular: Normal rate, regular rhythm and normal heart sounds.   Pulmonary/Chest: Effort normal.  Musculoskeletal: He exhibits edema and tenderness.       Left knee: He exhibits decreased range of motion and swelling. He exhibits no ecchymosis, no deformity, no laceration, no erythema, normal alignment and normal patellar mobility. Tenderness found. Lateral joint line and patellar tendon tenderness noted.       Legs: Decreased range of motion of knee- especially with flexion. He passively has to use his hands to move his left leg/knee due to  pain. Swelling and tenderness along lateral joint and patella. Some swelling also seen medially above the patella. No erythema or warmth. Good distal pulses. No neuro deficits noted.   Neurological: He is alert and oriented to person, place, and time. He has normal strength. No sensory deficit.  Skin: Skin is warm and dry. Capillary refill takes less than 2 seconds.  Psychiatric: He has a normal mood and affect. His behavior is  normal.    Urgent Care Course     Procedures (including critical care time)  Labs Review Labs Reviewed - No data to display  Imaging Review Dg Knee Complete 4 Views Left  Result Date: 02/21/2017 CLINICAL DATA:  Left knee pain after playing basketball. EXAM: LEFT KNEE - COMPLETE 4+ VIEW COMPARISON:  None. FINDINGS: Bony structures are within normal without evidence of fracture or dislocation. There is a small joint effusion present unchanged. IMPRESSION: No acute fracture.  Chronic joint effusion. Electronically Signed   By: Elberta Fortisaniel  Boyle M.D.   On: 02/21/2017 14:48     Visual Acuity Review  Right Eye Distance:   Left Eye Distance:   Bilateral Distance:    Right Eye Near:   Left Eye Near:    Bilateral Near:         MDM   1. Knee strain, left, initial encounter   2. Effusion of left knee   3. Elevated blood pressure reading without diagnosis of hypertension    Reviewed x-ray results with patient- has chronic joint effusion. Discussed that he needs to see an Orthopedic for further evaluation. Recommend start Naproxen 500mg  twice a day as needed for pain. May wear knee brace and use crutches for support. Discussed that we are unable to complete FMLA forms since we are not a PCP and are uncertain about length of any restrictions or treatment. Discussed that forms best completed by an Orthopedic. Note written for work for 2 days. Strongly encouraged to find a PCP since he most likely has HTN that is not being treated (BP has been elevated at a previous visit to Urgent Care) and may need additional care in the future. Recommend call an Orthopedic office (information provided) on Monday (2 days) to schedule appointment for further evaluation.    Sudie GrumblingAmyot, Leila Schuff Berry, NP 02/22/17 [phone]

## 2018-01-16 IMAGING — CR DG KNEE COMPLETE 4+V*L*
4 series · 4 of 4 positions shown · non-contrast
Comparison: None.

CLINICAL DATA: Left knee pain after playing basketball.

EXAM:
LEFT KNEE - COMPLETE 4+ VIEW

[knee ap (1 of 3)]
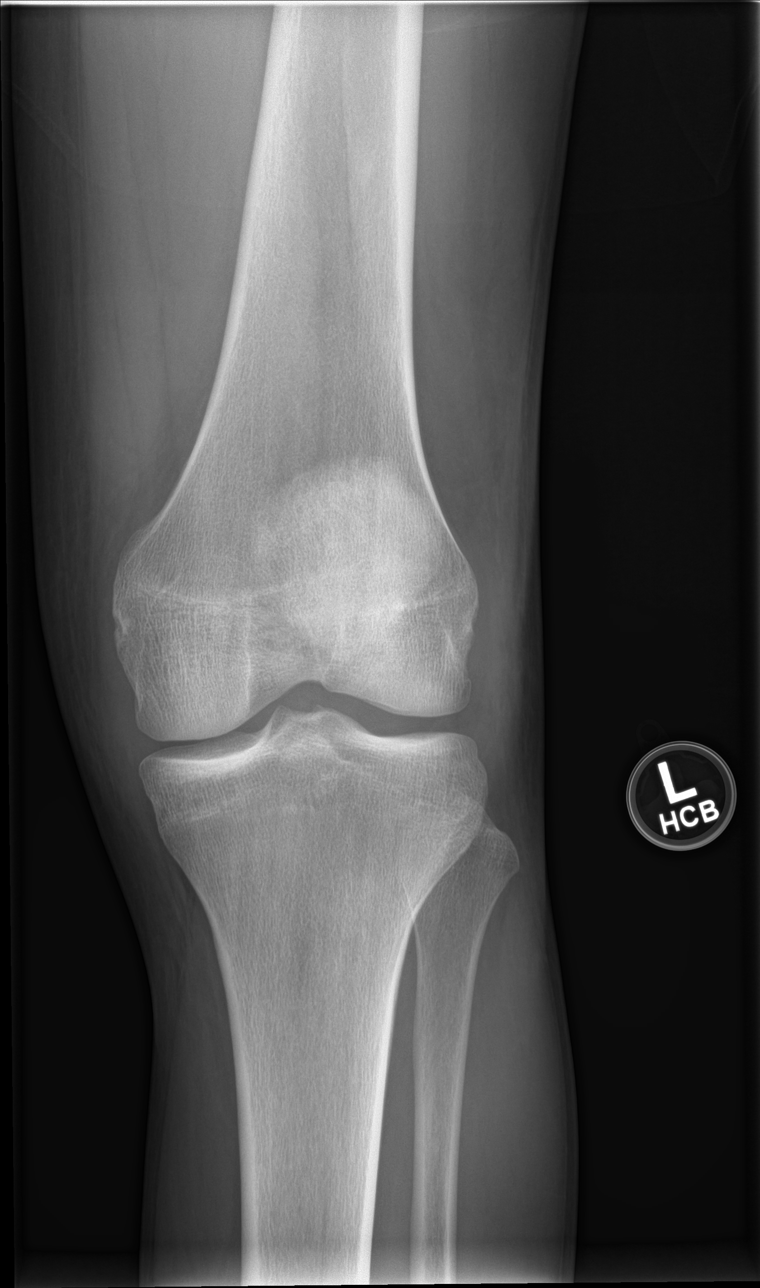

[knee ap (2 of 3)]
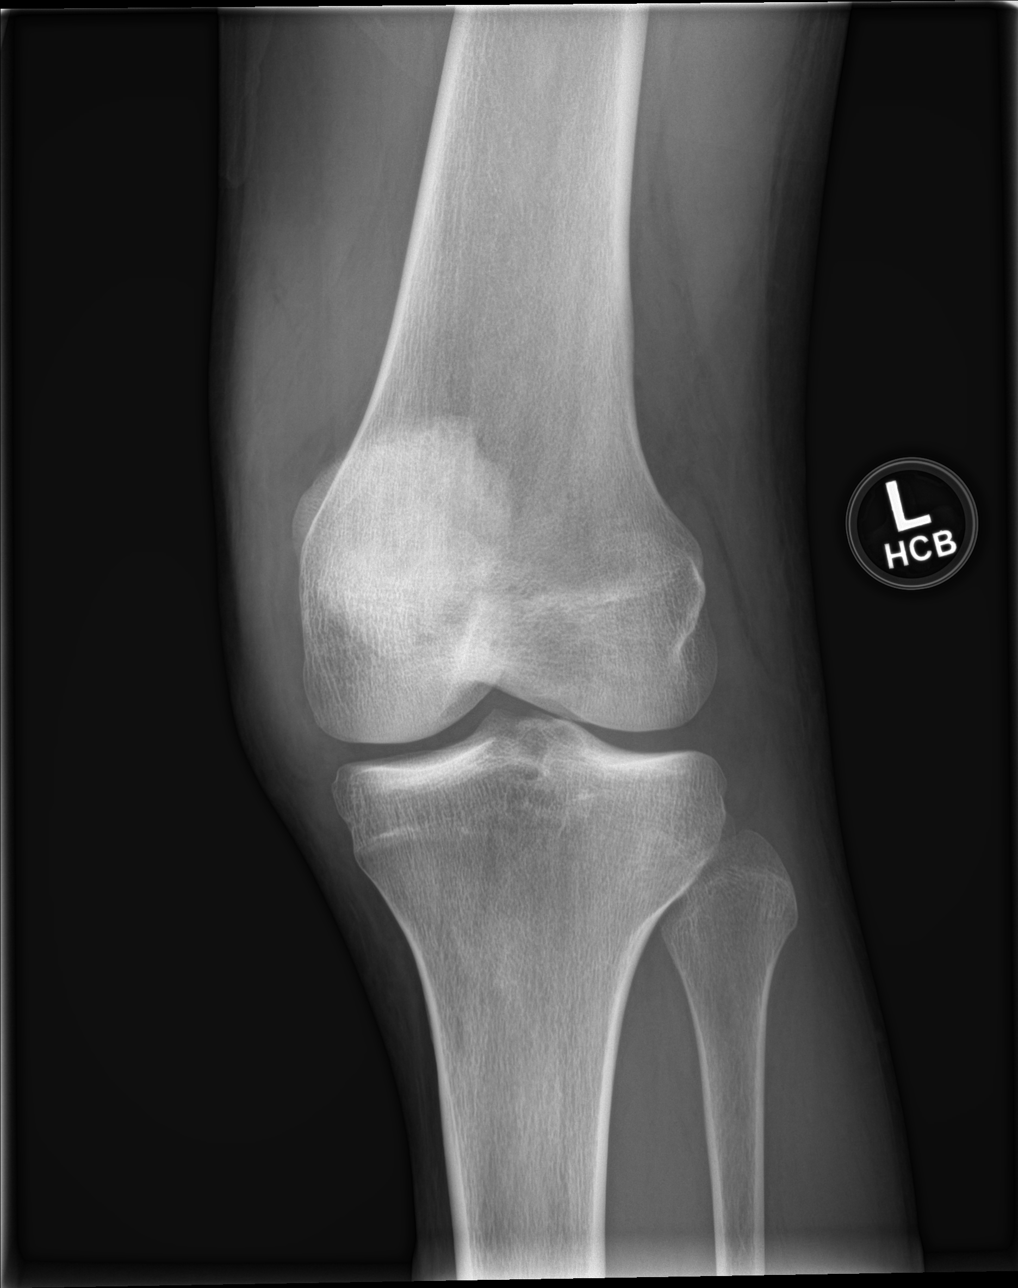

[knee ap (3 of 3)]
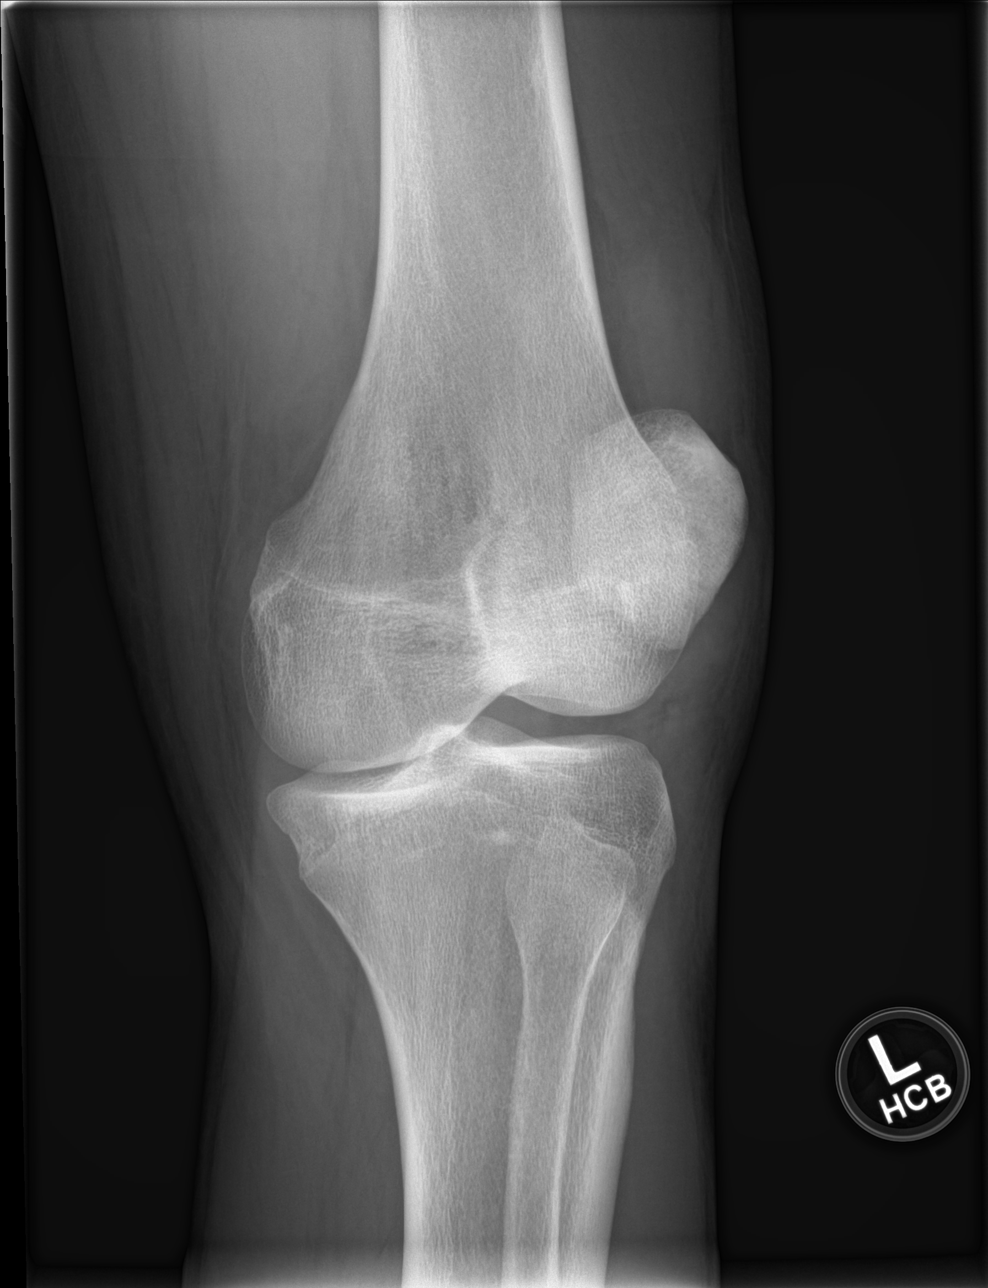

[knee lat]
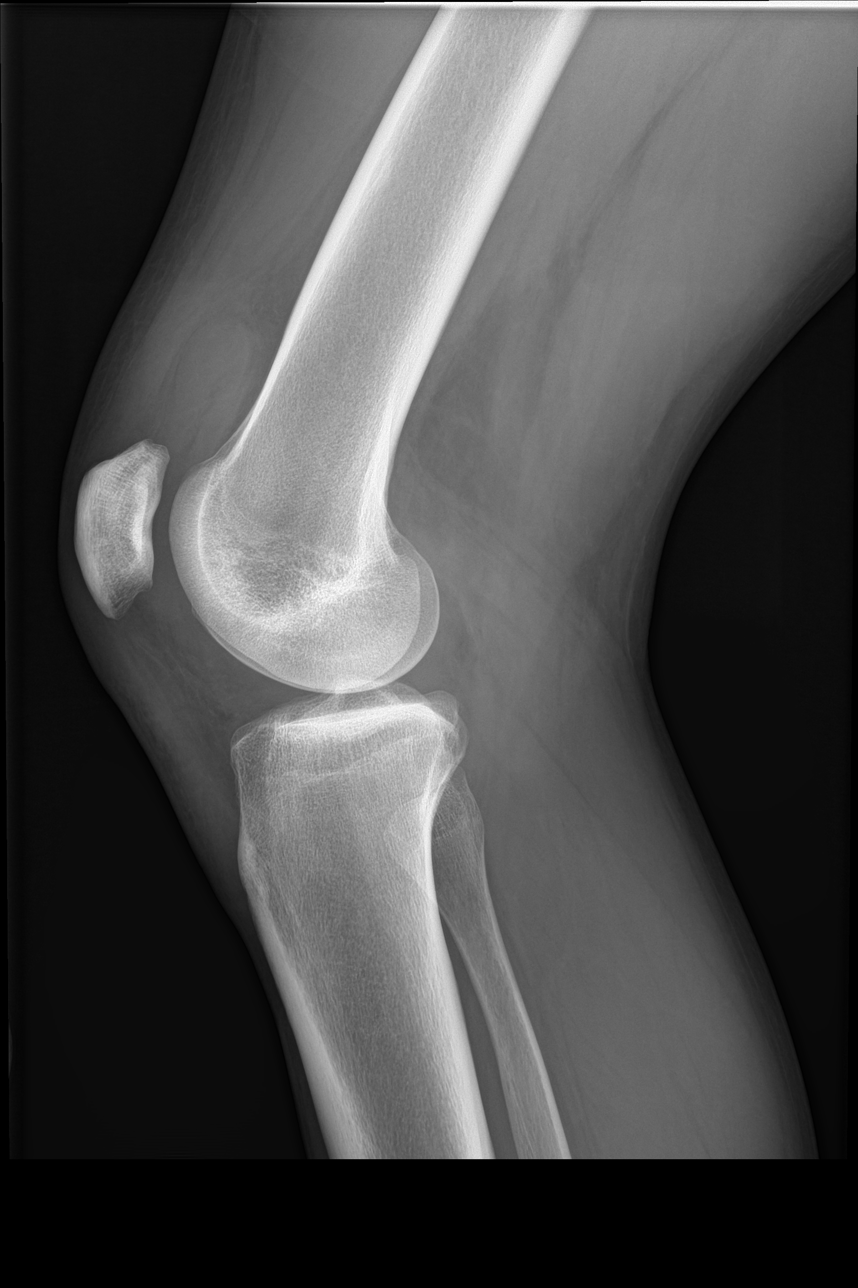

[4 of 4 positions shown; findings below may reference images not displayed]

FINDINGS: Bony structures are within normal without evidence of fracture or
dislocation. There is a small joint effusion present unchanged.
IMPRESSION: No acute fracture.  Chronic joint effusion.
